# Patient Record
Sex: Male | Born: 1990 | Race: Black or African American | Hispanic: No | Marital: Married | Smoking: Current some day smoker
Health system: Southern US, Community
[De-identification: ages and names within clinical notes are randomized; demographics above are authoritative.]

---
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
## 2009-08-11 ENCOUNTER — Telehealth (INDEPENDENT_AMBULATORY_CARE_PROVIDER_SITE_OTHER): Payer: Self-pay | Admitting: *Deleted

## 2009-08-12 ENCOUNTER — Emergency Department (HOSPITAL_COMMUNITY): Admission: EM | Admit: 2009-08-12 | Discharge: 2009-08-12 | Payer: Self-pay | Admitting: Emergency Medicine

## 2011-01-19 IMAGING — CR DG ANKLE COMPLETE 3+V*L*
3 series · 3 of 3 positions shown · non-contrast
Comparison: [HOSPITAL] left foot radiographs 08/12/2009.

CLINICAL DATA: Twisting injury with lateral left ankle pain.

LEFT ANKLE COMPLETE - 3+ VIEW

[view not recorded (1 of 3)]
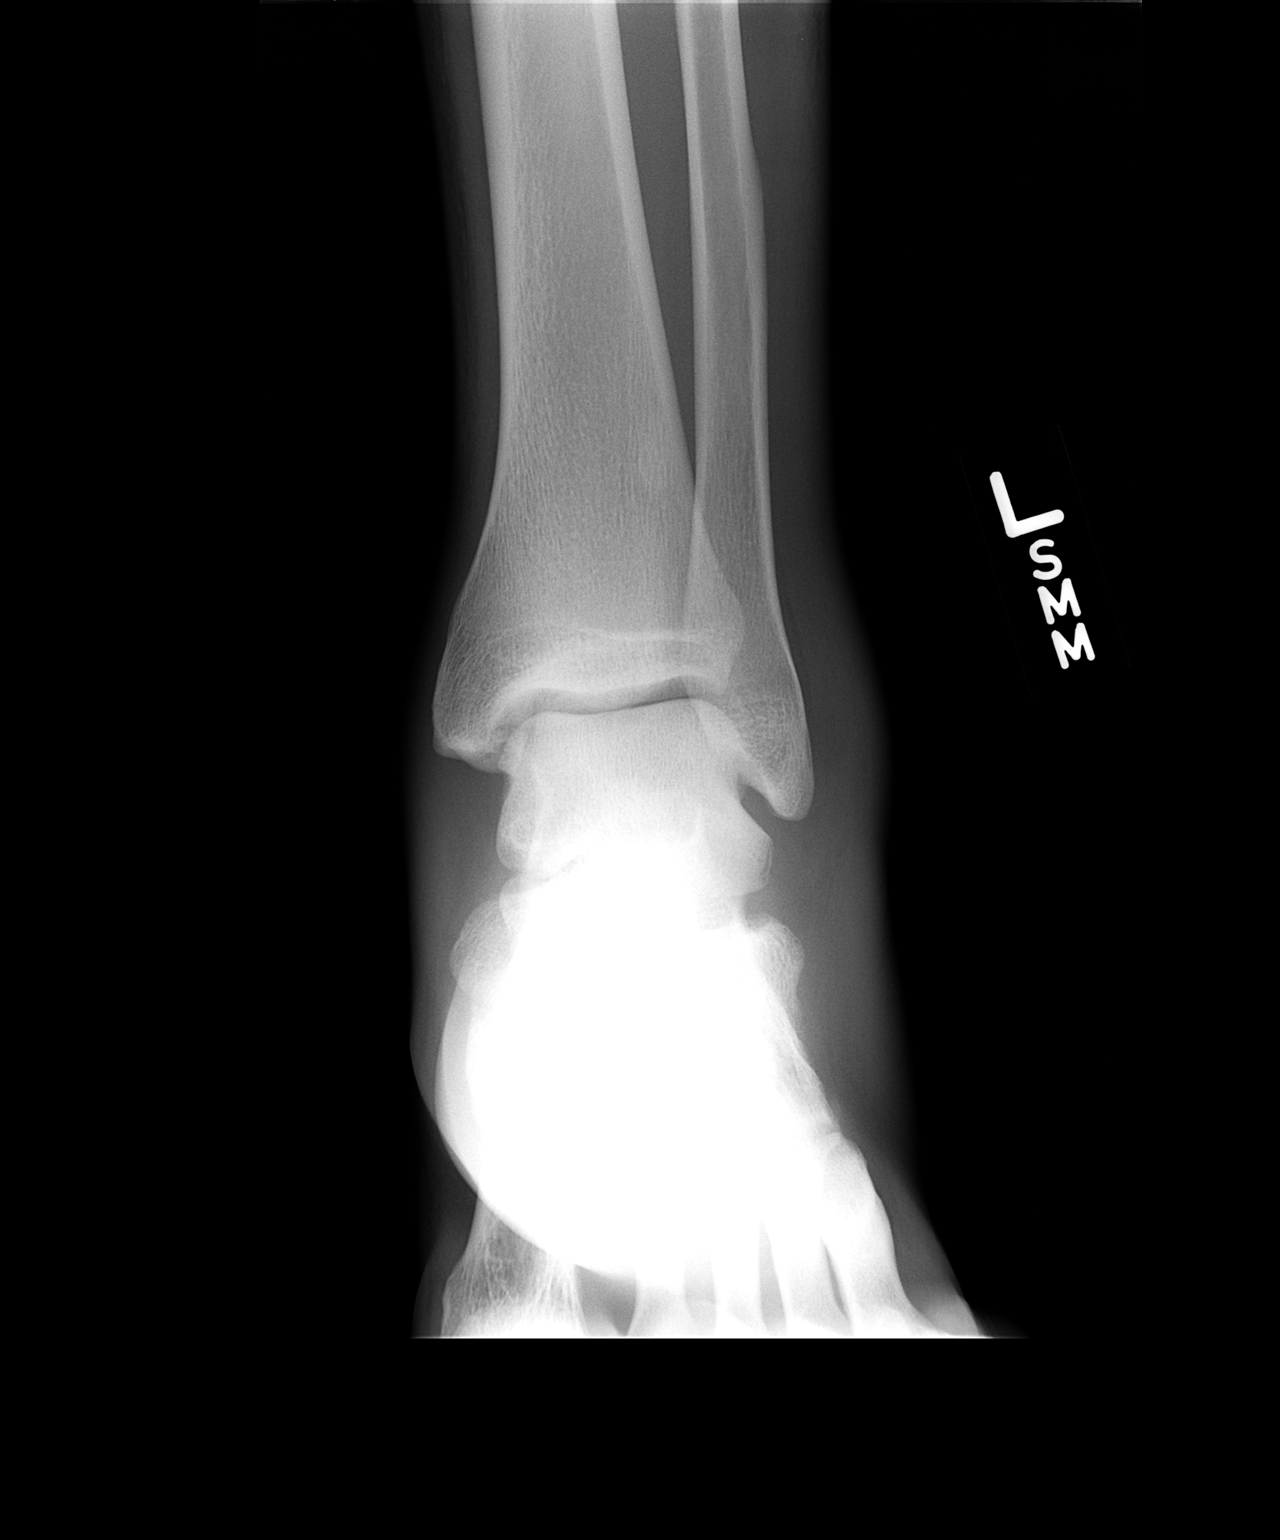

[view not recorded (2 of 3)]
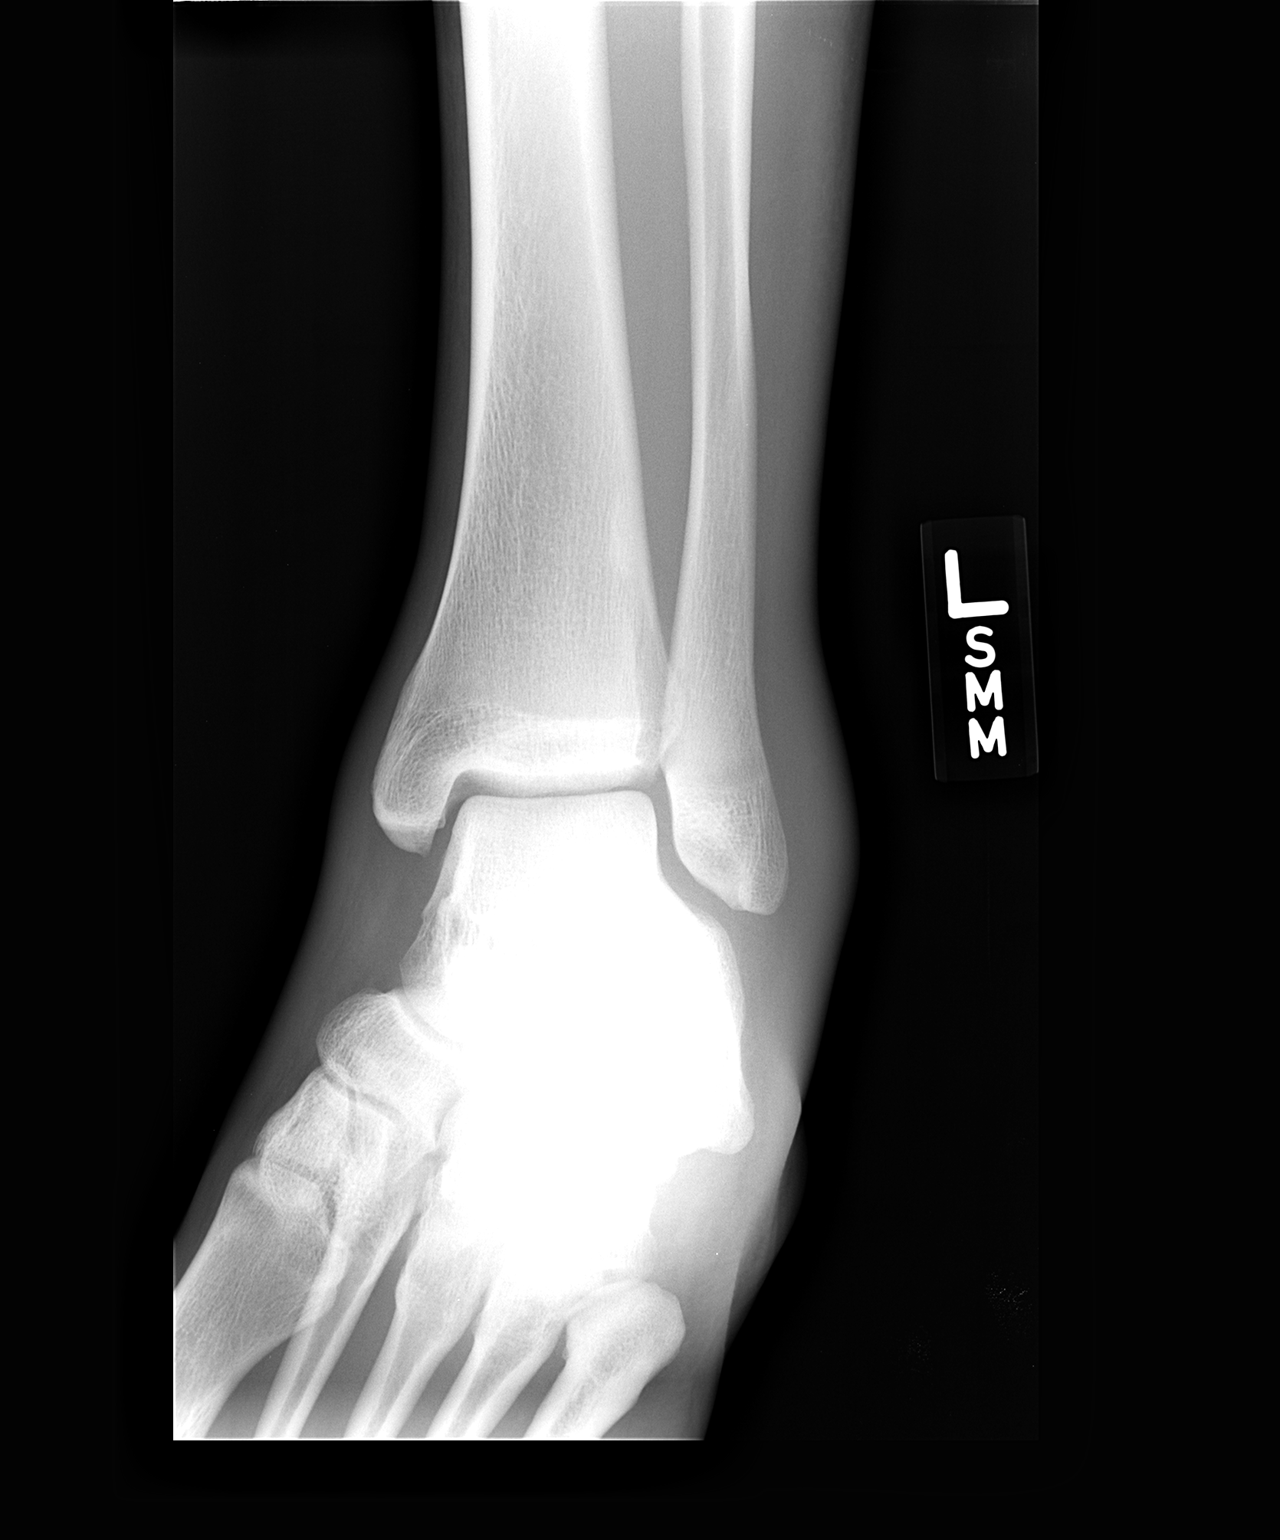

[view not recorded (3 of 3)]
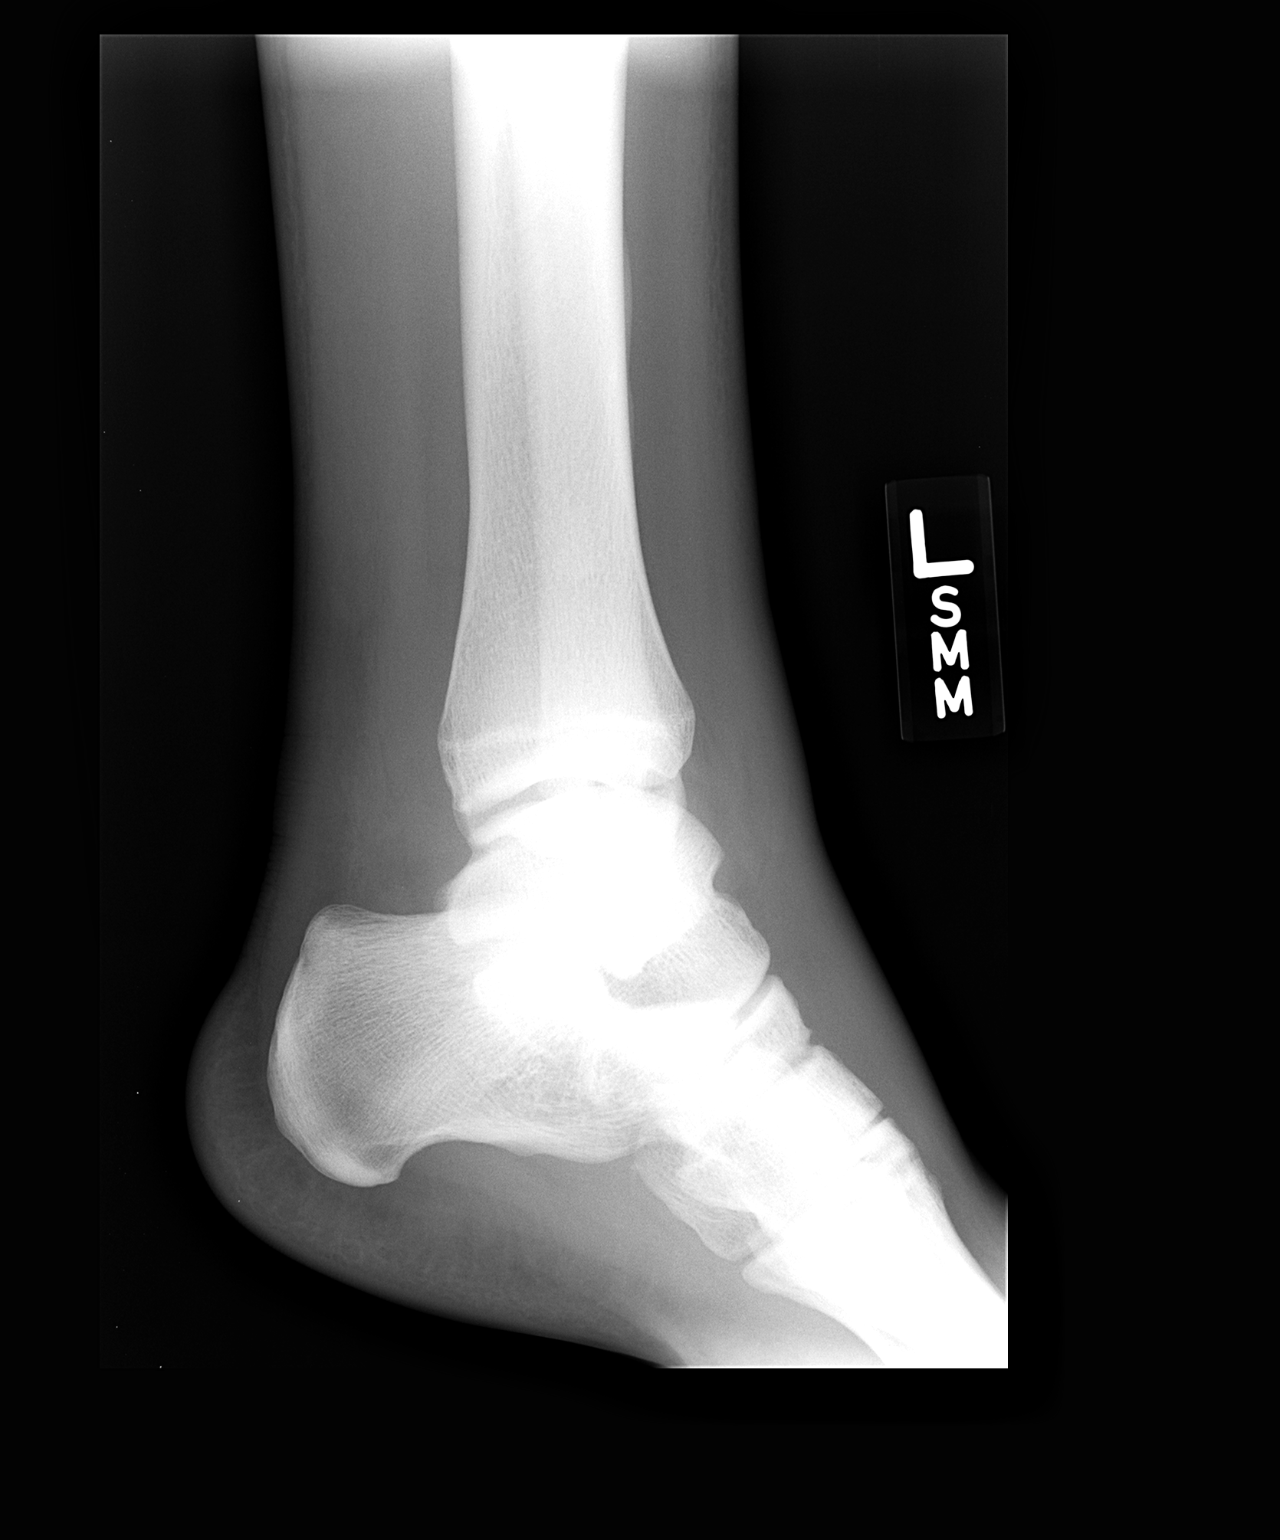

[3 of 3 positions shown; findings below may reference images not displayed]

FINDINGS: Lateral soft tissue swelling is seen.  Ankle mortise is
symmetric.  No acute fracture, subluxation, dislocation, radiopaque
foreign bodies seen.
IMPRESSION: 1.  Lateral soft tissue swelling.
2.  Otherwise, negative.

## 2011-01-19 IMAGING — CR DG FOOT COMPLETE 3+V*L*
3 series · 3 of 3 positions shown · non-contrast
Comparison: None

CLINICAL DATA: Injury playing basketball.  Pain superior and
lateral.

LEFT FOOT - COMPLETE 3+ VIEW

[view not recorded (1 of 3)]
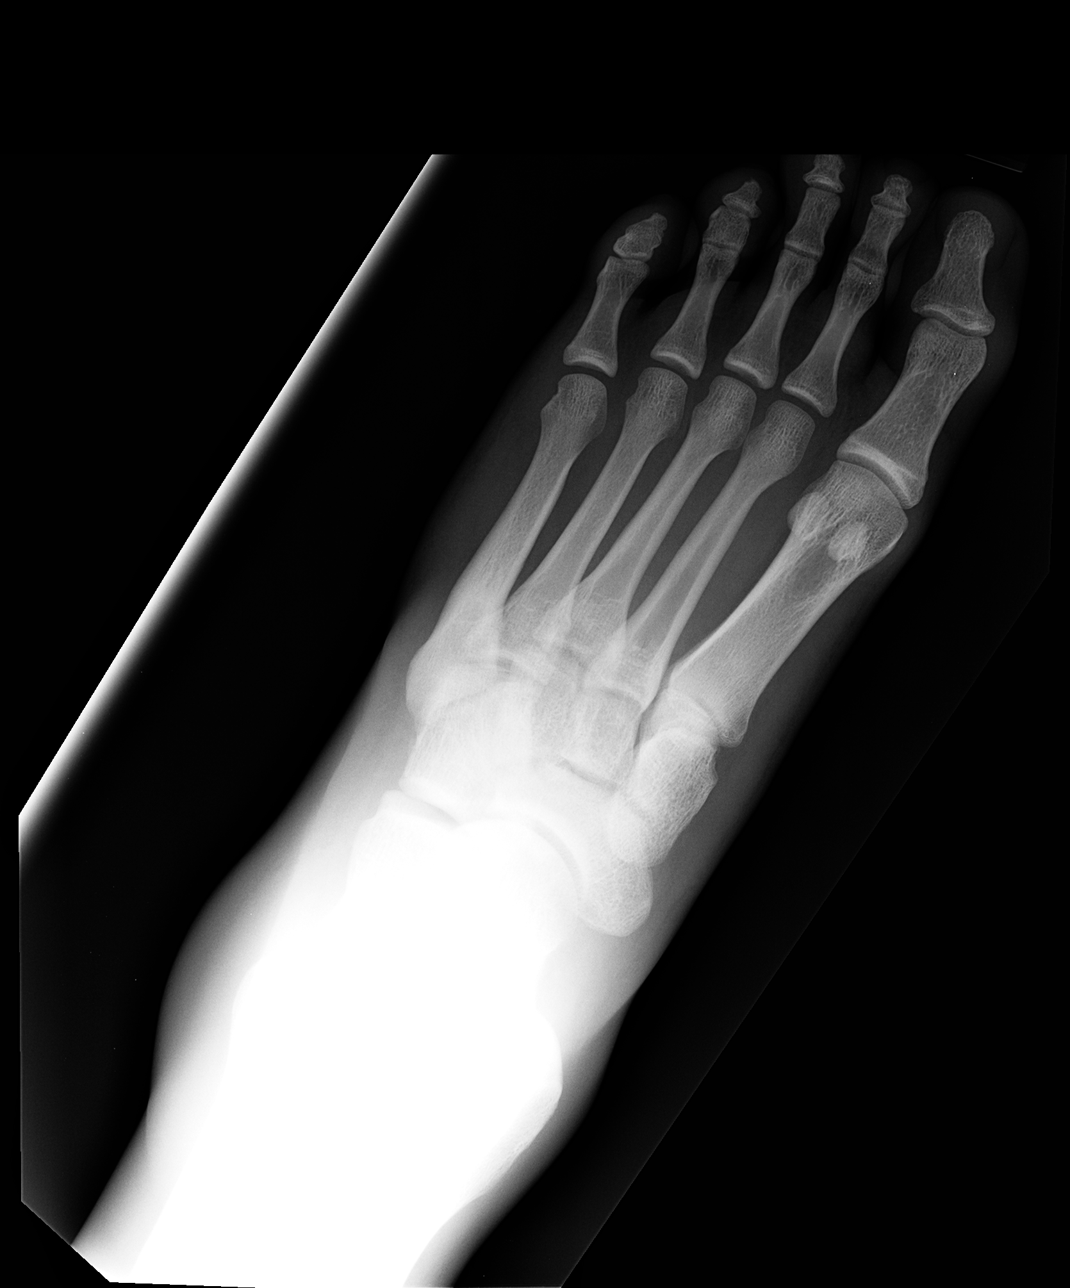

[view not recorded (2 of 3)]
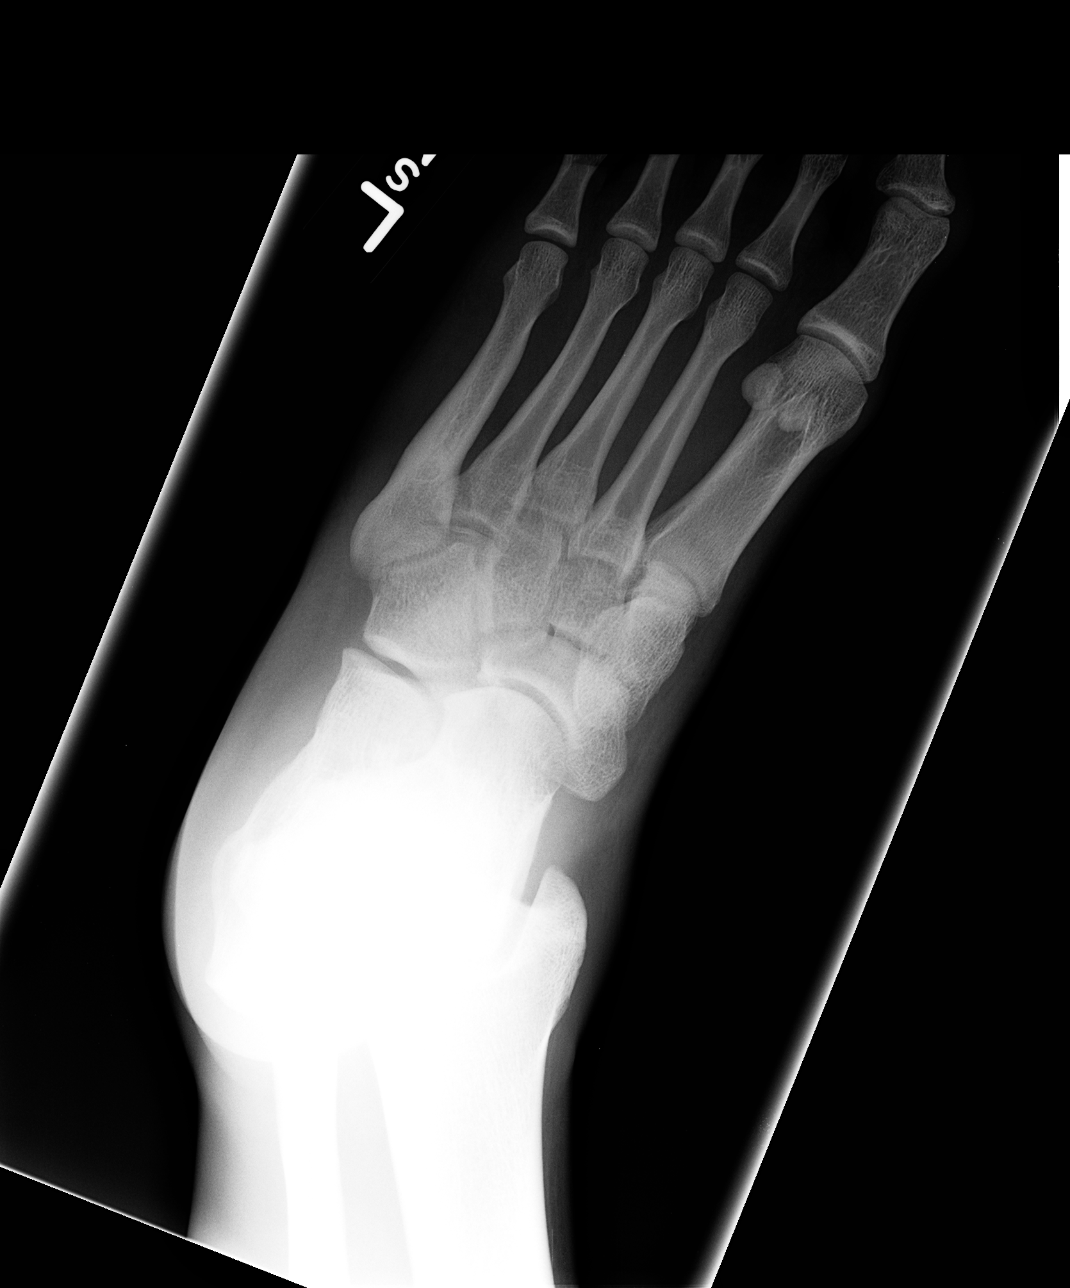

[view not recorded (3 of 3)]
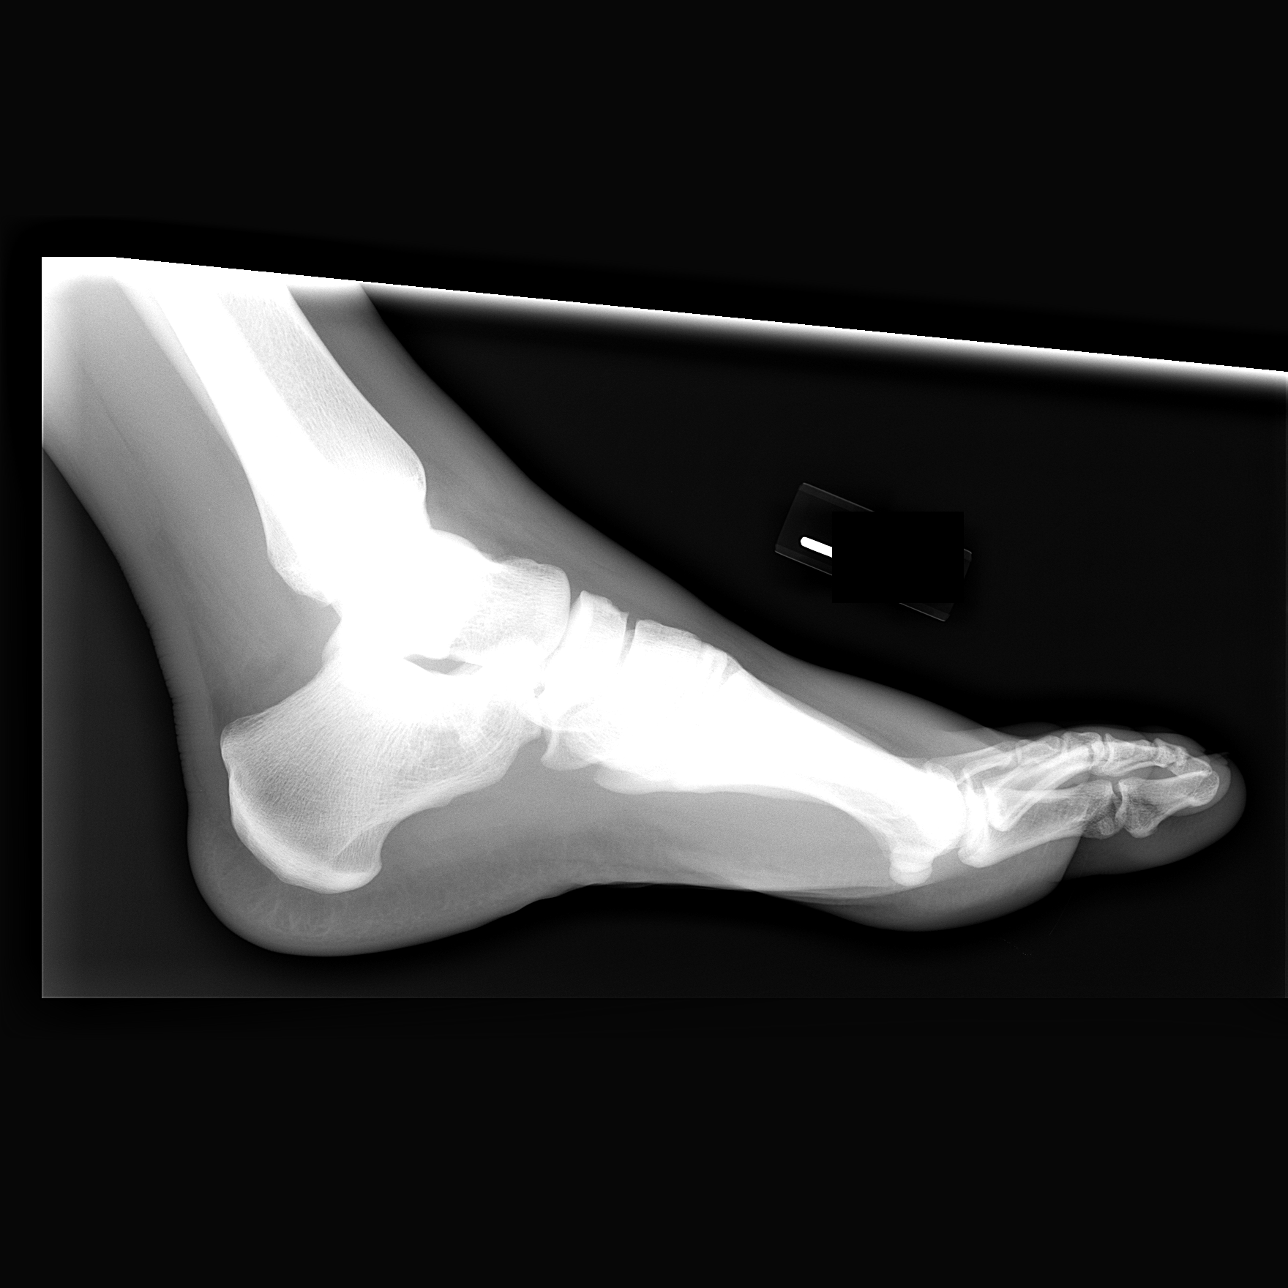

[3 of 3 positions shown; findings below may reference images not displayed]

FINDINGS: There is no evidence of fracture or dislocation.  There
is no evidence of arthropathy or other focal bone abnormality.
Soft tissues are unremarkable.
IMPRESSION: Negative.

## 2015-07-14 ENCOUNTER — Encounter (HOSPITAL_BASED_OUTPATIENT_CLINIC_OR_DEPARTMENT_OTHER): Payer: Self-pay | Admitting: Emergency Medicine

## 2015-07-14 ENCOUNTER — Emergency Department (HOSPITAL_BASED_OUTPATIENT_CLINIC_OR_DEPARTMENT_OTHER)
Admission: EM | Admit: 2015-07-14 | Discharge: 2015-07-15 | Disposition: A | Discharge status: Home / Self Care | Payer: Self-pay | Attending: Emergency Medicine | Admitting: Emergency Medicine

## 2015-07-14 DIAGNOSIS — R112 Nausea with vomiting, unspecified: Secondary | ICD-10-CM | POA: Insufficient documentation

## 2015-07-14 DIAGNOSIS — R61 Generalized hyperhidrosis: Secondary | ICD-10-CM | POA: Insufficient documentation

## 2015-07-14 DIAGNOSIS — Z720 Tobacco use: Secondary | ICD-10-CM | POA: Insufficient documentation

## 2015-07-14 DIAGNOSIS — J029 Acute pharyngitis, unspecified: Secondary | ICD-10-CM | POA: Insufficient documentation

## 2015-07-14 NOTE — ED Provider Notes (Signed)
CSN: [id number]     Arrival date & time 07/14/15  2339 History  This chart was scribed for Geoffery Lyons, MD by Budd Palmer, ED Scribe. This patient was seen in room MH01/MH01 and the patient's care was started at 11:58 PM.    Chief Complaint  Patient presents with  . Sore Throat   The history is provided by the patient. No language interpreter was used.   HPI Comments: Brent Blackburn is a 24 y.o. male who presents to the Emergency Department complaining of a sore throat onset this morning. He reports associated subjective fever, diaphoresis, vomiting, and nausea. He is here because he needs a note for work, as he left there this morning after feeling increasingly ill. He denies any sick contacts and states he has no other medical issues. Pt denies diarrhea.  History reviewed. No pertinent past medical history. History reviewed. No pertinent past surgical history. History reviewed. No pertinent family history. Social History  Substance Use Topics  . Smoking status: Current Some Day Smoker  . Smokeless tobacco: Never Used  . Alcohol Use: Yes    Review of Systems  Constitutional: Positive for diaphoresis.  HENT: Positive for sore throat.   Gastrointestinal: Positive for nausea and vomiting. Negative for diarrhea.  All other systems reviewed and are negative.   Allergies  Review of patient's allergies indicates no known allergies.  Home Medications   Prior to Admission medications   Not on File   BP 139/91 mmHg  Pulse 57  Temp(Src) 98.5 F (36.9 C) (Oral)  Resp 16  Ht  (1.956 m)  Wt 220 lb (99.791 kg)  BMI 26.08 kg/m2  SpO2 99% Physical Exam  Constitutional: He is oriented to person, place, and time. He appears well-developed and well-nourished.  HENT:  Head: Normocephalic and atraumatic.  Mouth/Throat: No oropharyngeal exudate.  Mild erythema of the posterior oropharynx. No exudates.  Eyes: Conjunctivae are normal. Right eye exhibits no discharge. Left eye  exhibits no discharge.  Cardiovascular: Normal rate, regular rhythm and normal heart sounds.   Pulmonary/Chest: Effort normal and breath sounds normal. No respiratory distress.  Lymphadenopathy:    He has no cervical adenopathy.  Neurological: He is alert and oriented to person, place, and time. No cranial nerve deficit. He exhibits normal muscle tone. Coordination normal.  Skin: Skin is warm and dry. No rash noted. He is not diaphoretic. No erythema.  Psychiatric: He has a normal mood and affect.  Nursing note and vitals reviewed.   ED Course  Procedures  DIAGNOSTIC STUDIES: Oxygen Saturation is 99% on RA, normal by my interpretation.    COORDINATION OF CARE: 12:02 AM - Discussed plans to test for strep. Pt advised of plan for treatment and pt agrees.  Labs Review Labs Reviewed  RAPID STREP SCREEN (NOT AT [facility])  CULTURE, GROUP A STREP    Imaging Review No results found. I have personally reviewed and evaluated these images and lab results as part of my medical decision-making.   EKG Interpretation None      MDM   Final diagnoses:  None    Strep test negative. Symptoms likely viral in nature. Will recommend ibuprofen, plenty of fluids, rest, and time. To return as needed for any problems.  I personally performed the services described in this documentation, which was scribed in my presence. The recorded information has been reviewed and is accurate.      Geoffery Lyons, MD 07/15/15 [phone]

## 2015-07-14 NOTE — ED Notes (Signed)
Pt reports sore throat onset this AM with occasional abd pain

## 2015-07-15 LAB — RAPID STREP SCREEN (MED CTR MEBANE ONLY): Streptococcus, Group A Screen (Direct): NEGATIVE

## 2015-07-15 NOTE — Discharge Instructions (Signed)
Ibuprofen 600 mg every 6 hours as needed for pain or fever.  Drink plenty of fluids and get plenty of rest.  Return to the emergency department if symptoms significantly worsen or change.   Pharyngitis Pharyngitis is redness, pain, and swelling (inflammation) of your pharynx.  CAUSES  Pharyngitis is usually caused by infection. Most of the time, these infections are from viruses (viral) and are part of a cold. However, sometimes pharyngitis is caused by bacteria (bacterial). Pharyngitis can also be caused by allergies. Viral pharyngitis may be spread from person to person by coughing, sneezing, and personal items or utensils (cups, forks, spoons, toothbrushes). Bacterial pharyngitis may be spread from person to person by more intimate contact, such as kissing.  SIGNS AND SYMPTOMS  Symptoms of pharyngitis include:   Sore throat.   Tiredness (fatigue).   Low-grade fever.   Headache.  Joint pain and muscle aches.  Skin rashes.  Swollen lymph nodes.  Plaque-like film on throat or tonsils (often seen with bacterial pharyngitis). DIAGNOSIS  Your health care provider will ask you questions about your illness and your symptoms. Your medical history, along with a physical exam, is often all that is needed to diagnose pharyngitis. Sometimes, a rapid strep test is done. Other lab tests may also be done, depending on the suspected cause.  TREATMENT  Viral pharyngitis will usually get better in 3-4 days without the use of medicine. Bacterial pharyngitis is treated with medicines that kill germs (antibiotics).  HOME CARE INSTRUCTIONS   Drink enough water and fluids to keep your urine clear or pale yellow.   Only take over-the-counter or prescription medicines as directed by your health care provider:   If you are prescribed antibiotics, make sure you finish them even if you start to feel better.   Do not take aspirin.   Get lots of rest.   Gargle with 8 oz of salt water ( tsp  of salt per 1 qt of water) as often as every 1-2 hours to soothe your throat.   Throat lozenges (if you are not at risk for choking) or sprays may be used to soothe your throat. SEEK MEDICAL CARE IF:   You have large, tender lumps in your neck.  You have a rash.  You cough up green, yellow-brown, or bloody spit. SEEK IMMEDIATE MEDICAL CARE IF:   Your neck becomes stiff.  You drool or are unable to swallow liquids.  You vomit or are unable to keep medicines or liquids down.  You have severe pain that does not go away with the use of recommended medicines.  You have trouble breathing (not caused by a stuffy nose). MAKE SURE YOU:   Understand these instructions.  Will watch your condition.  Will get help right away if you are not doing well or get worse. Document Released: 11/08/2005 Document Revised: 08/29/2013 Document Reviewed: 07/16/2013 [facility] Patient Information 2015 [place], [place]. This information is not intended to replace advice given to you by your health care provider. Make sure you discuss any questions you have with your health care provider.

## 2015-07-17 ENCOUNTER — Encounter (HOSPITAL_BASED_OUTPATIENT_CLINIC_OR_DEPARTMENT_OTHER): Payer: Self-pay | Admitting: *Deleted

## 2015-07-17 ENCOUNTER — Emergency Department (HOSPITAL_BASED_OUTPATIENT_CLINIC_OR_DEPARTMENT_OTHER)
Admission: EM | Admit: 2015-07-17 | Discharge: 2015-07-17 | Disposition: A | Discharge status: Home / Self Care | Payer: Self-pay | Attending: Emergency Medicine | Admitting: Emergency Medicine

## 2015-07-17 DIAGNOSIS — R1111 Vomiting without nausea: Secondary | ICD-10-CM | POA: Insufficient documentation

## 2015-07-17 DIAGNOSIS — R197 Diarrhea, unspecified: Secondary | ICD-10-CM | POA: Insufficient documentation

## 2015-07-17 DIAGNOSIS — Z720 Tobacco use: Secondary | ICD-10-CM | POA: Insufficient documentation

## 2015-07-17 DIAGNOSIS — J069 Acute upper respiratory infection, unspecified: Secondary | ICD-10-CM | POA: Insufficient documentation

## 2015-07-17 NOTE — ED Provider Notes (Signed)
CSN: [id number]     Arrival date & time 07/17/15  1737 History  This chart was scribed for Tilden Fossa, MD by Gwenyth Ober, ED Scribe. This patient was seen in room MH04/MH04 and the patient's care was started at 6:10 PM.    Chief Complaint  Patient presents with  . Sore Throat   The history is provided by the patient. No language interpreter was used.    HPI Comments: Brent Blackburn is a 24 y.o. male who presents to the Emergency Department complaining of gradually improving, constant, moderate sore throat that started 3 days ago. He states intermittent subjective fever, sneezing, cough, congestion, chills, diarrhea, post-tussive vomiting and intermittent abdominal pain as associated symptoms. He has tried Nyquil and Dayquil with no relief. Pt was seen in the ED on 8/22 and was diagnosed with viral pharyngitis. 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 He is an intermittent smoker. Pt denies nausea.   History reviewed. No pertinent past medical history. History reviewed. No pertinent past surgical history. No family history on file. Social History  Substance Use Topics  . Smoking status: Current Some Day Smoker  . Smokeless tobacco: Never Used  . Alcohol Use: Yes    Review of Systems  Constitutional: Positive for fever and chills.  HENT: Positive for congestion, sneezing and sore throat.   Respiratory: Positive for cough.   Gastrointestinal: Positive for vomiting, abdominal pain and diarrhea. Negative for nausea.  All other systems reviewed and are negative.  Allergies  Review of patient's allergies indicates no known allergies.  Home Medications   Prior to Admission medications   Not on File   BP 133/85 mmHg  Pulse 66  Temp(Src) 97.9 F (36.6 C) (Oral)  Resp 18  Ht  (1.956 m)  Wt 220 lb (99.791 kg)  BMI 26.08 kg/m2  SpO2 100% Physical Exam  Constitutional: He is oriented to person, place, and  time. He appears well-developed and well-nourished.  HENT:  Head: Normocephalic and atraumatic.  Right Ear: Tympanic membrane and external ear normal.  Left Ear: Tympanic membrane and external ear normal.  Mouth/Throat: No oropharyngeal exudate.  Mild erythema without swelling in the posterior oropharynx  Cardiovascular: Normal rate, regular rhythm and normal heart sounds.   No murmur heard. Pulmonary/Chest: Effort normal and breath sounds normal. No respiratory distress.  Abdominal: There is no tenderness. There is no rebound and no guarding.  Musculoskeletal: He exhibits no edema or tenderness.  Neurological: He is alert and oriented to person, place, and time.  Skin: Skin is warm and dry.  Nursing note and vitals reviewed.   ED Course  Procedures   DIAGNOSTIC STUDIES: Oxygen Saturation is 100% on RA, normal by my interpretation.    COORDINATION OF CARE: 6:15 PM Discussed treatment plan with pt which includes OTC Cold and Flu medicine. Pt agreed to plan.    MDM   Final diagnoses:  Acute URI   Patient here for evaluation of cough, sore throat, congestion, nausea. Patient is nontoxic appearing on examination well-hydrated. No evidence of PTA, pneumonia, otitis media. Discussed with patient home care for viral URI with return precautions.  I personally performed the services described in this documentation, which was scribed in my presence. The recorded information has been reviewed and is accurate.   Tilden Fossa, MD 07/17/15 2008

## 2015-07-17 NOTE — Discharge Instructions (Signed)
Upper Respiratory Infection, Adult An upper respiratory infection (URI) is also sometimes known as the common cold. The upper respiratory tract includes the nose, sinuses, throat, trachea, and bronchi. Bronchi are the airways leading to the lungs. Most people improve within 1 week, but symptoms can last up to 2 weeks. A residual cough may last even longer.  CAUSES Many different viruses can infect the tissues lining the upper respiratory tract. The tissues become irritated and inflamed and often become very moist. Mucus production is also common. A cold is contagious. You can easily spread the virus to others by oral contact. This includes kissing, sharing a glass, coughing, or sneezing. Touching your mouth or nose and then touching a surface, which is then touched by another person, can also spread the virus. SYMPTOMS  Symptoms typically develop 1 to 3 days after you come in contact with a cold virus. Symptoms vary from person to person. They may include:  Runny nose.  Sneezing.  Nasal congestion.  Sinus irritation.  Sore throat.  Loss of voice (laryngitis).  Cough.  Fatigue.  Muscle aches.  Loss of appetite.  Headache.  Low-grade fever. DIAGNOSIS  You might diagnose your own cold based on familiar symptoms, since most people get a cold 2 to 3 times a year. Your caregiver can confirm this based on your exam. Most importantly, your caregiver can check that your symptoms are not due to another disease such as strep throat, sinusitis, pneumonia, asthma, or epiglottitis. Blood tests, throat tests, and X-rays are not necessary to diagnose a common cold, but they may sometimes be helpful in excluding other more serious diseases. Your caregiver will decide if any further tests are required. RISKS AND COMPLICATIONS  You may be at risk for a more severe case of the common cold if you smoke cigarettes, have chronic heart disease (such as heart failure) or lung disease (such as asthma), or if  you have a weakened immune system. The very young and very old are also at risk for more serious infections. Bacterial sinusitis, middle ear infections, and bacterial pneumonia can complicate the common cold. The common cold can worsen asthma and chronic obstructive pulmonary disease (COPD). Sometimes, these complications can require emergency medical care and may be life-threatening. PREVENTION  The best way to protect against getting a cold is to practice good hygiene. Avoid oral or hand contact with people with cold symptoms. Wash your hands often if contact occurs. There is no clear evidence that vitamin C, vitamin E, echinacea, or exercise reduces the chance of developing a cold. However, it is always recommended to get plenty of rest and practice good nutrition. TREATMENT  Treatment is directed at relieving symptoms. There is no cure. Antibiotics are not effective, because the infection is caused by a virus, not by bacteria. Treatment may include:  Increased fluid intake. Sports drinks offer valuable electrolytes, sugars, and fluids.  Breathing heated mist or steam (vaporizer or shower).  Eating chicken soup or other clear broths, and maintaining good nutrition.  Getting plenty of rest.  Using gargles or lozenges for comfort.  Controlling fevers with ibuprofen or acetaminophen as directed by your caregiver.  Increasing usage of your inhaler if you have asthma. Zinc gel and zinc lozenges, taken in the first 24 hours of the common cold, can shorten the duration and lessen the severity of symptoms. Pain medicines may help with fever, muscle aches, and throat pain. A variety of non-prescription medicines are available to treat congestion and runny nose. Your caregiver   can make recommendations and may suggest nasal or lung inhalers for other symptoms.  HOME CARE INSTRUCTIONS   Only take over-the-counter or prescription medicines for pain, discomfort, or fever as directed by your  caregiver.  Use a warm mist humidifier or inhale steam from a shower to increase air moisture. This may keep secretions moist and make it easier to breathe.  Drink enough water and fluids to keep your urine clear or pale yellow.  Rest as needed.  Return to work when your temperature has returned to normal or as your caregiver advises. You may need to stay home longer to avoid infecting others. You can also use a face mask and careful hand washing to prevent spread of the virus. SEEK MEDICAL CARE IF:   After the first few days, you feel you are getting worse rather than better.  You need your caregiver's advice about medicines to control symptoms.  You develop chills, worsening shortness of breath, or brown or red sputum. These may be signs of pneumonia.  You develop yellow or brown nasal discharge or pain in the face, especially when you bend forward. These may be signs of sinusitis.  You develop a fever, swollen neck glands, pain with swallowing, or white areas in the back of your throat. These may be signs of strep throat. SEEK IMMEDIATE MEDICAL CARE IF:   You have a fever.  You develop severe or persistent headache, ear pain, sinus pain, or chest pain.  You develop wheezing, a prolonged cough, cough up blood, or have a change in your usual mucus (if you have chronic lung disease).  You develop sore muscles or a stiff neck. Document Released: 05/04/2001 Document Revised: 01/31/2012 Document Reviewed: 02/13/2014 [employer] Patient Information 2015 [employer]. This information is not intended to replace advice given to you by your health care provider. Make sure you discuss any questions you have with your health care provider.  

## 2015-07-17 NOTE — ED Notes (Signed)
Sore throat. States he was seen here Monday and had a negative strep test. States he is back because he feels no better.

## 2015-07-19 LAB — CULTURE, GROUP A STREP
